# Patient Record
Sex: Male | Born: 1985 | Race: White | Hispanic: No | Marital: Married | Smoking: Never smoker
Health system: Southern US, Community
[De-identification: ages and names within clinical notes are randomized; demographics above are authoritative.]

---

## 2020-08-14 ENCOUNTER — Other Ambulatory Visit: Payer: Self-pay | Admitting: Family Medicine

## 2020-08-15 ENCOUNTER — Other Ambulatory Visit: Payer: Self-pay | Admitting: Family Medicine

## 2020-08-15 DIAGNOSIS — R1010 Upper abdominal pain, unspecified: Secondary | ICD-10-CM

## 2020-08-29 ENCOUNTER — Ambulatory Visit
Admission: RE | Admit: 2020-08-29 | Discharge: 2020-08-29 | Disposition: A | Discharge status: Home / Self Care | Payer: Self-pay | Source: Ambulatory Visit | Attending: Family Medicine | Admitting: Family Medicine

## 2020-08-29 DIAGNOSIS — R1010 Upper abdominal pain, unspecified: Secondary | ICD-10-CM

## 2020-09-12 ENCOUNTER — Other Ambulatory Visit: Payer: Self-pay | Admitting: Family Medicine

## 2020-09-12 DIAGNOSIS — R160 Hepatomegaly, not elsewhere classified: Secondary | ICD-10-CM

## 2020-09-16 ENCOUNTER — Other Ambulatory Visit: Payer: Self-pay

## 2020-09-16 ENCOUNTER — Ambulatory Visit
Admission: RE | Admit: 2020-09-16 | Discharge: 2020-09-16 | Disposition: A | Discharge status: Home / Self Care | Payer: Managed Care, Other (non HMO) | Source: Ambulatory Visit | Attending: Family Medicine | Admitting: Family Medicine

## 2020-09-16 DIAGNOSIS — R160 Hepatomegaly, not elsewhere classified: Secondary | ICD-10-CM

## 2020-09-16 IMAGING — MR MR ABDOMEN WO/W CM
12 of 17 series · 27 of 48 positions shown · IV contrast (multihance)
Comparison: Ultrasound [DATE]

CLINICAL DATA: Evaluate possible liver abnormality. Ultrasound
showed a hypoechoic lesion in the left hepatic lobe.

EXAM:
MRI ABDOMEN WITHOUT AND WITH CONTRAST
TECHNIQUE: Multiplanar multisequence MR imaging of the abdomen was performed
both before and after the administration of intravenous contrast.
CONTRAST:  20mL MULTIHANCE GADOBENATE DIMEGLUMINE 529 MG/ML IV SOLN

[Series 3: T2 · coronal · 5.0mm · 1.76mm/px · 1 of 40 slices shown (1 of 3)]
[im 1/40]
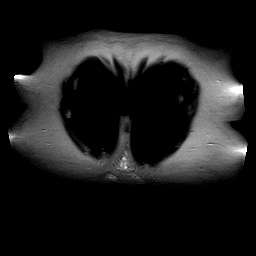

[Series 5: T2 · axial · 6.5mm · 0.88mm/px · 1 of 44 slices shown (2 of 3)]
[im 1/44]
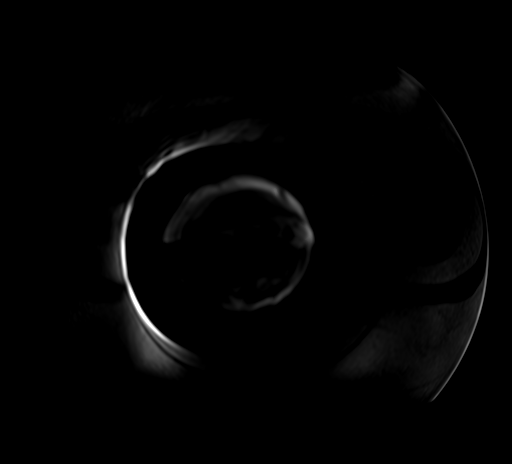

[Series 6: ep2d_diff_b50_500_800_p2 · axial · 6.0mm · 2.34mm/px · z∈[-176,+159]mm · 3 of 132 slices shown]
[im 1/132]
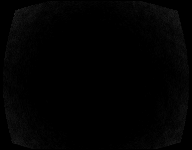
[im 66/132]
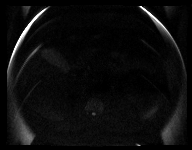
[im 132/132]
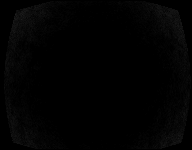

[Series 7: [id number]_500_800_p2_adc · axial · 6.0mm · 2.34mm/px · 1 of 44 slices shown]
[im 1/44]
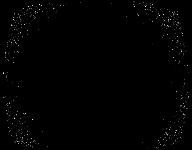

[Series 8: T2 · axial · 5.5mm · 1.76mm/px · 1 of 42 slices shown (3 of 3)]
[im 1/42]
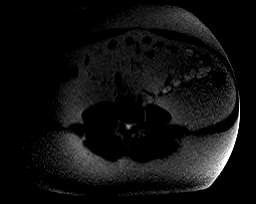

[Series 9: axial in out · axial · 6.5mm · 0.88mm/px · 1 of 80 slices shown]
[im 1/80]
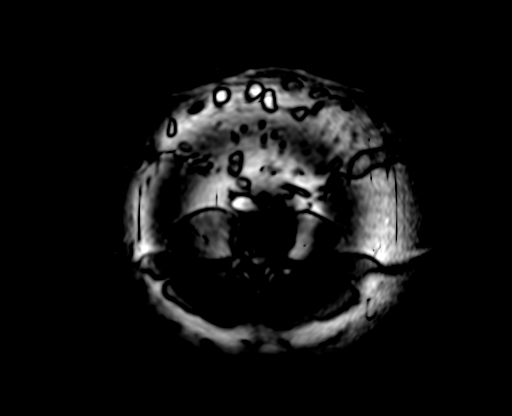

[Series 10: axial tru fisp · axial · 5.5mm · 1.76mm/px · 1 of 46 slices shown]
[im 1/46]
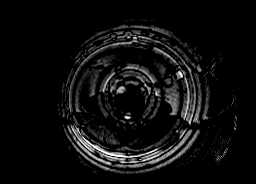

[Series 11: T1 dynamic · axial · non-contrast · 2.3mm · 0.88mm/px · z∈[-169,+110]mm · 4 of 120 slices shown]
[im 1/120]
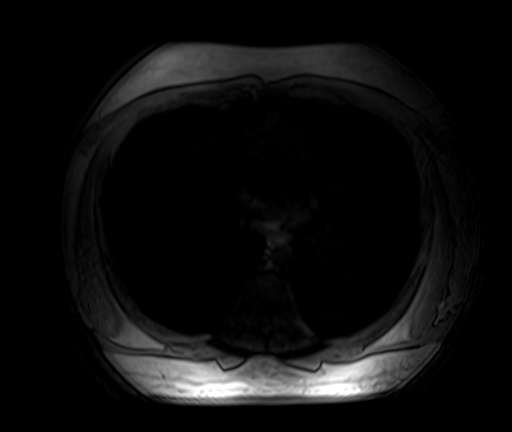
[im 40/120]
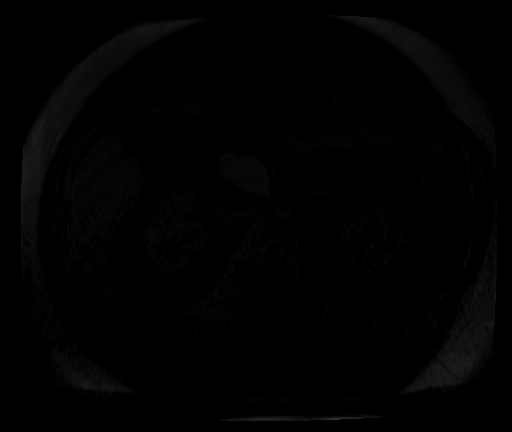
[im 80/120]
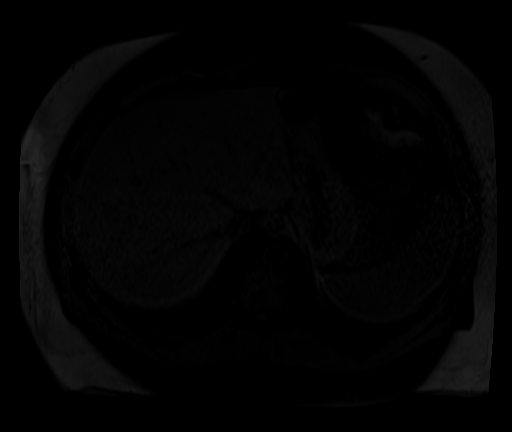
[im 120/120]
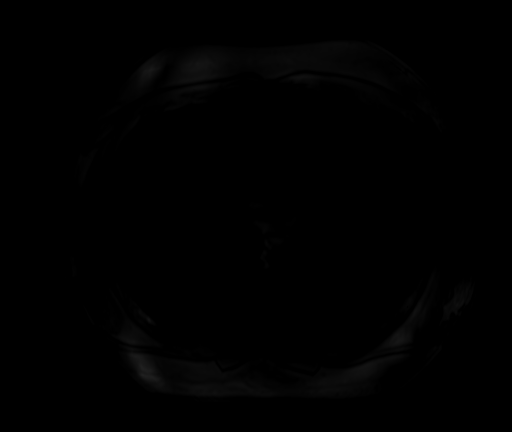

[Series 12: post 25 sec · axial · 2.3mm · 0.88mm/px · z∈[-169,+110]mm · 4 of 120 slices shown]
[im 1/120]
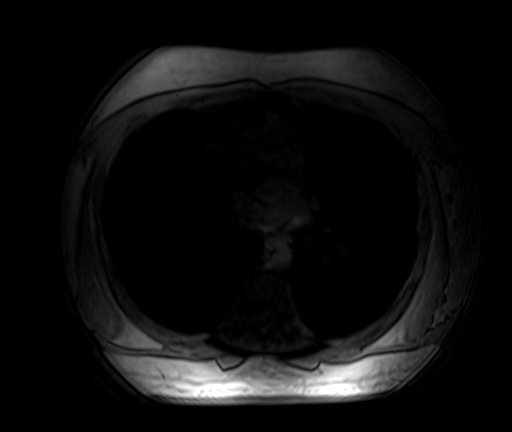
[im 40/120]
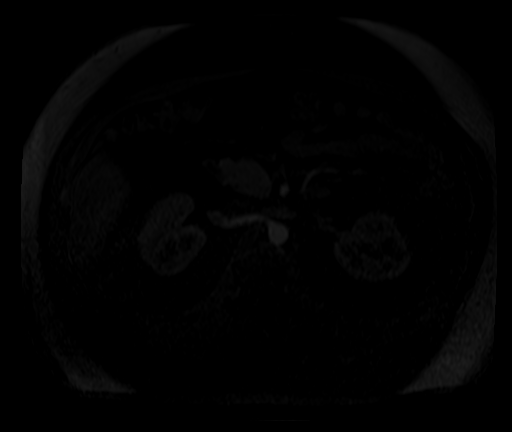
[im 80/120]
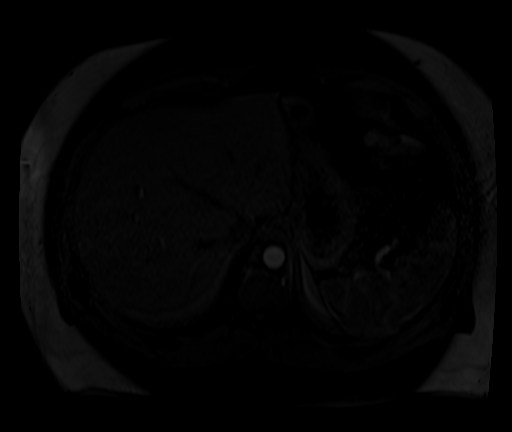
[im 120/120]
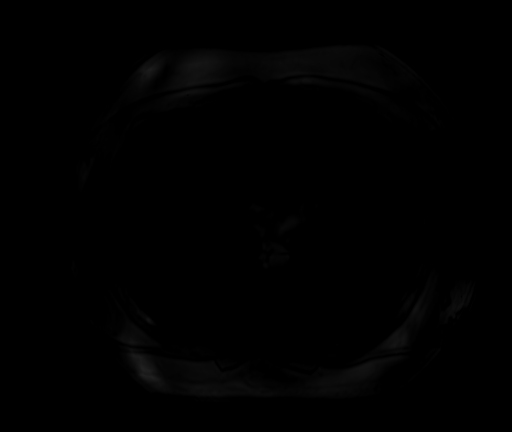

[Series 13: post 25 sec_sub · axial · 2.3mm · 0.88mm/px · z∈[-169,+110]mm · 4 of 117 slices shown]
[im 1/117]
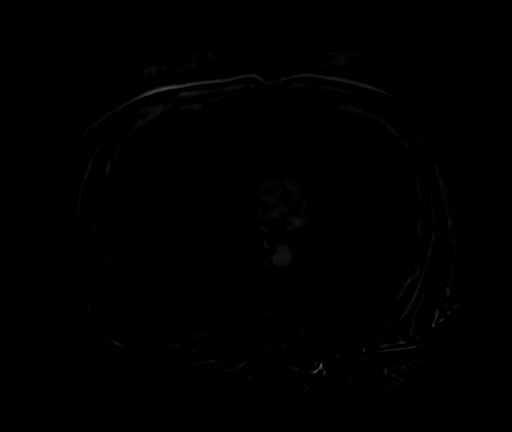
[im 39/117]
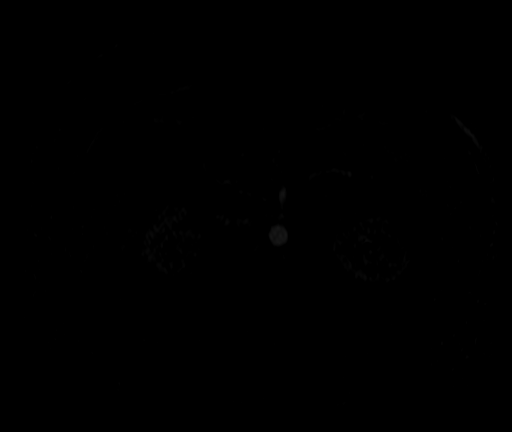
[im 78/117]
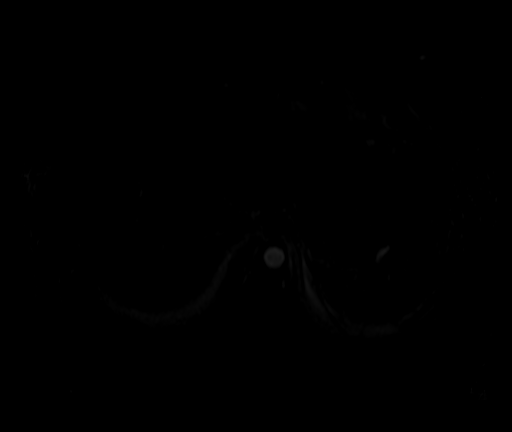
[im 117/117]
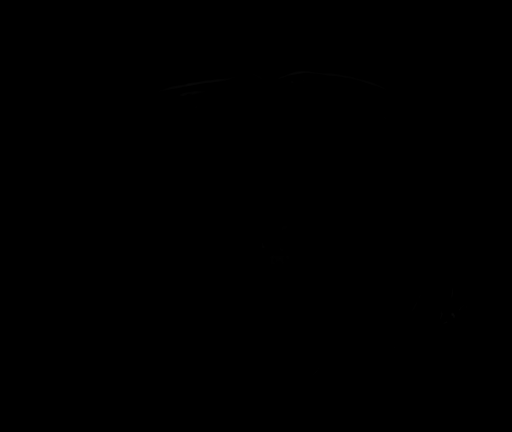

[Series 14: post 45 sec · axial · 2.3mm · 0.88mm/px · z∈[-169,+110]mm · 4 of 120 slices shown]
[im 1/120]
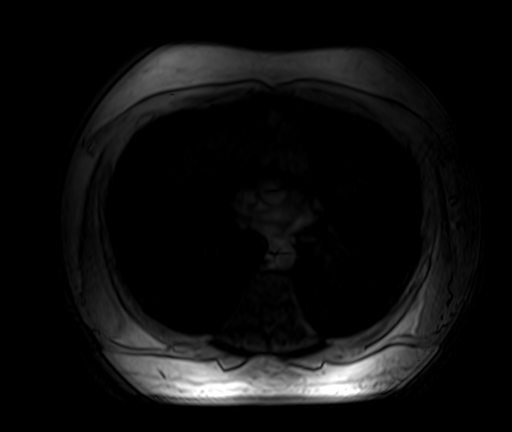
[im 40/120]
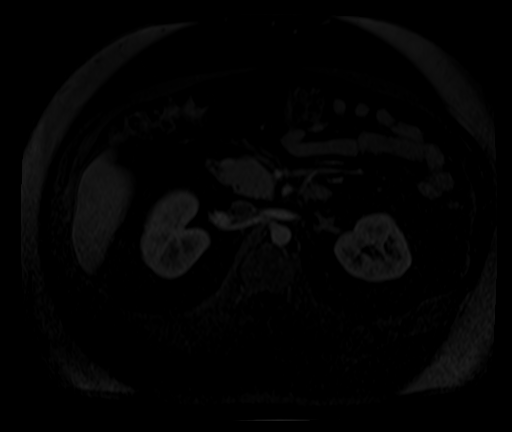
[im 80/120]
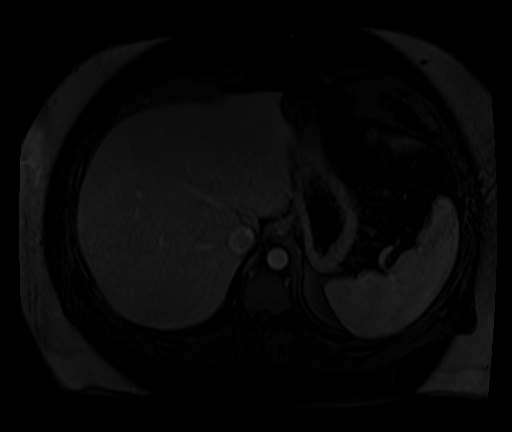
[im 120/120]
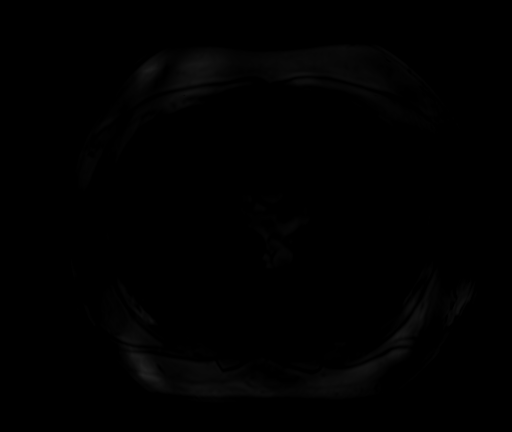

[Series 15: post 45 sec_sub · axial · 2.3mm · 0.88mm/px · z∈[-169,-78]mm · 2 of 120 slices shown]
[im 1/120]
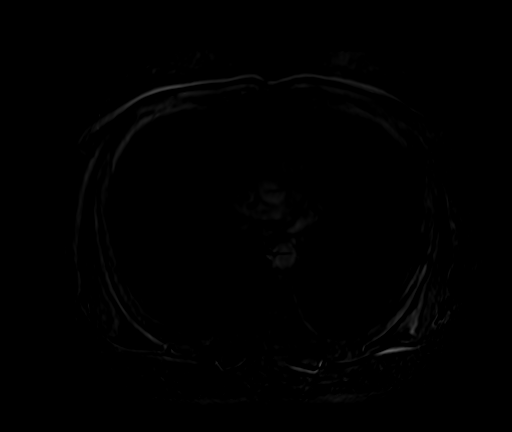
[im 40/120]
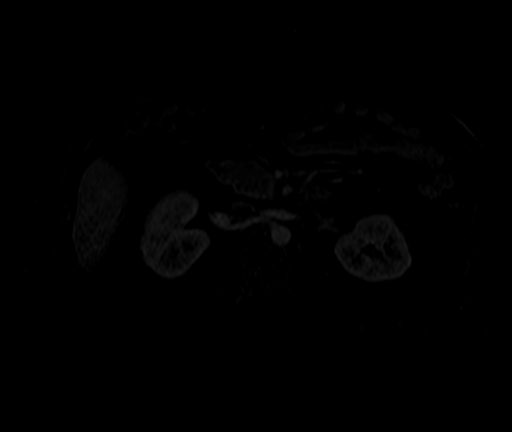

[27 of 48 positions shown; findings below may reference images not displayed]

FINDINGS: Lower chest: The lung bases are grossly clear. No pulmonary lesions
or pleural effusion. No pericardial effusion.

Hepatobiliary: There is mild diffuse fatty infiltration of the liver
noted on the out of phase T1 weighted gradient echo sequence.

There is a small enhancing lesion in segment 3 of the liver
anteriorly. It measures a maximum of 14 mm. Lesion is not visible on
the pre contrast images and is best seen on the 45 second
post-contrast images. Given its stealth appearance and enhancement
pattern FNH would be a strong possibility.

I do not see any other hepatic lesions. The gallbladder is
unremarkable. Normal caliber and course of the common bile duct.

Pancreas: No mass, inflammation or ductal dilatation. The pancreas
is short. There is no pancreatic tail and only part of the body.
This is most likely a form of dorsal agenesis.

Spleen:  Normal size.  No focal lesions.

Adrenals/Urinary Tract: The adrenal glands and kidneys are
unremarkable. No renal lesions or hydronephrosis.

Stomach/Bowel: The stomach, duodenum, visualized small bowel and
visualized colon are grossly normal.

Vascular/Lymphatic: The aorta and branch vessels are patent. The
major venous structures are patent. No mesenteric or retroperitoneal
mass or adenopathy.

Other:  No ascites or abdominal wall hernia.

Musculoskeletal: No significant bony findings.
IMPRESSION: 1. 14 mm enhancing lesion in segment 3 of the liver anteriorly.
Given its stealth appearance and enhancement pattern FNH would be a
strong possibility. Recommend followup MR examination in 6 months
with Eovist for contrast.
2. Mild diffuse fatty infiltration of the liver.
3. No other significant abdominal findings, mass lesions or
adenopathy.

## 2020-09-16 MED ORDER — GADOBENATE DIMEGLUMINE 529 MG/ML IV SOLN
20.0000 mL | Freq: Once | INTRAVENOUS | Status: AC | PRN
  Administered 2020-09-16: 20 mL via INTRAVENOUS

## 2021-04-03 ENCOUNTER — Other Ambulatory Visit: Payer: Self-pay | Admitting: Family Medicine

## 2021-04-03 DIAGNOSIS — R160 Hepatomegaly, not elsewhere classified: Secondary | ICD-10-CM

## 2021-04-25 ENCOUNTER — Ambulatory Visit
Admission: RE | Admit: 2021-04-25 | Discharge: 2021-04-25 | Disposition: A | Discharge status: Home / Self Care | Payer: Managed Care, Other (non HMO) | Source: Ambulatory Visit | Attending: Family Medicine | Admitting: Family Medicine

## 2021-04-25 DIAGNOSIS — R160 Hepatomegaly, not elsewhere classified: Secondary | ICD-10-CM

## 2021-04-25 IMAGING — MR MR ABDOMEN WO/W CM
17 series · 48 of 48 positions shown · IV contrast (20 ml multihance)
Comparison: MRI [DATE] and ultrasound [DATE]

CLINICAL DATA: Follow-up hepatic lesion

EXAM:
MRI ABDOMEN WITHOUT AND WITH CONTRAST
TECHNIQUE: Multiplanar multisequence MR imaging of the abdomen was performed
both before and after the administration of intravenous contrast.
CONTRAST:  20mL MULTIHANCE GADOBENATE DIMEGLUMINE 529 MG/ML IV SOLN

[Series 3: T2 · coronal · 5.0mm · 1.60mm/px · 3 of 45 slices shown (1 of 3)]
[im 1/45]
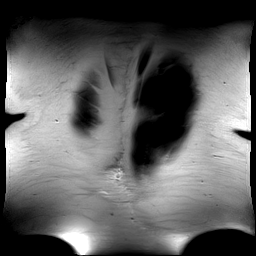
[im 23/45]
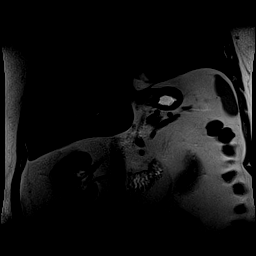
[im 45/45]
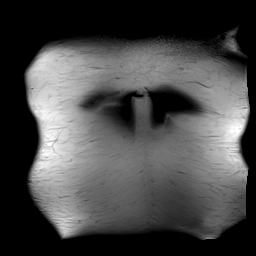

[Series 4: T1 · axial · 3.0mm · 1.25mm/px · z∈[-81,+156]mm · 7 of 160 slices shown]
[im 1/160]
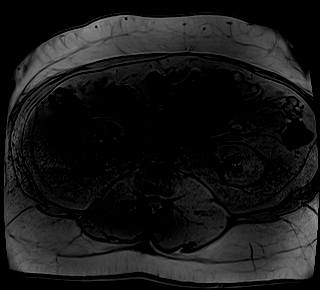
[im 27/160]
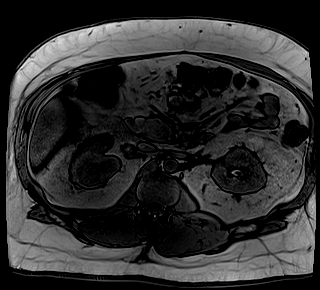
[im 54/160]
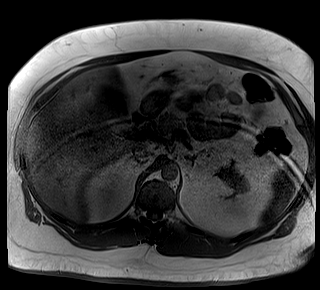
[im 80/160]
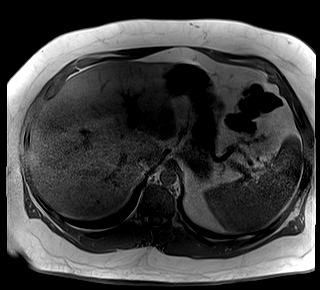
[im 107/160]
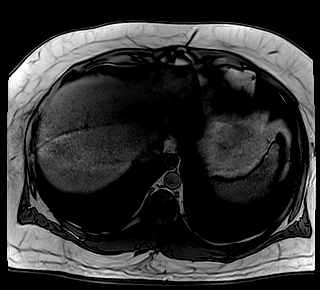
[im 133/160]
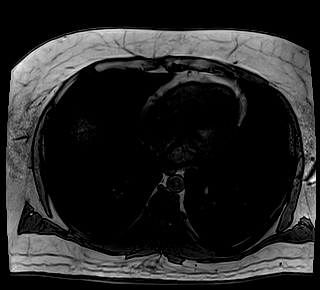
[im 160/160]
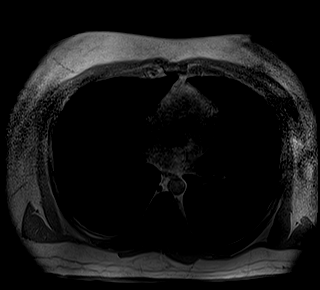

[Series 5: bSSFP · axial · 5.0mm · 1.25mm/px · 1 of 38 slices shown]
[im 1/38]
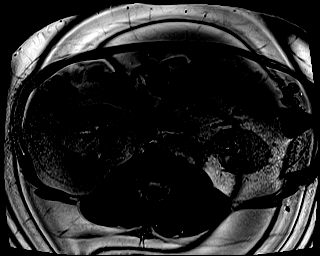

[Series 6: T2 · axial · 5.0mm · 1.56mm/px · 1 of 38 slices shown (2 of 3)]
[im 1/38]
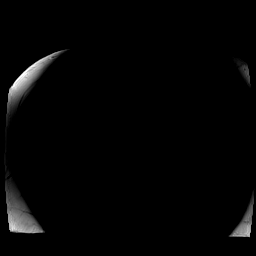

[Series 7: DWI · axial · 5.0mm · 1.49mm/px · z∈[-21,+200]mm · 4 of 114 slices shown (1 of 2)]
[im 1/114]
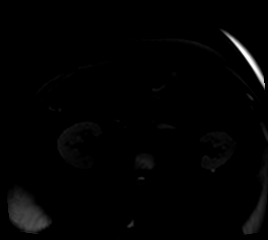
[im 38/114]
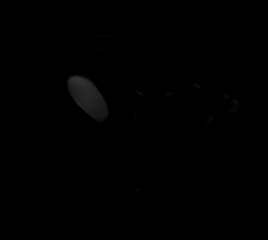
[im 76/114]
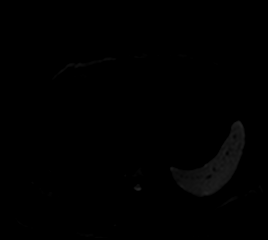
[im 114/114]
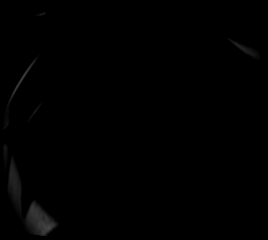

[Series 8: DWI · axial · 5.0mm · 1.49mm/px · 1 of 38 slices shown (2 of 2)]
[im 1/38]
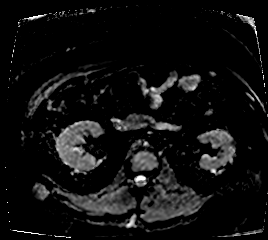

[Series 9: T2 · axial · 6.0mm · 1.25mm/px · 1 of 35 slices shown (3 of 3)]
[im 1/35]
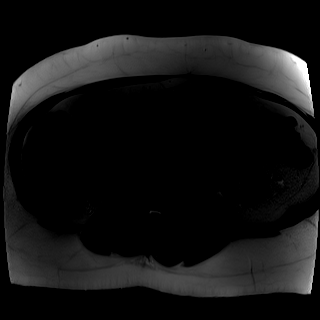

[Series 10: T1 dynamic · axial · non-contrast · 3.0mm · 1.25mm/px · z∈[-81,+156]mm · 3 of 80 slices shown]
[im 1/80]
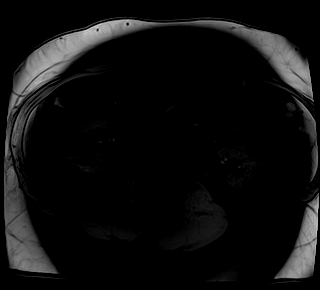
[im 40/80]
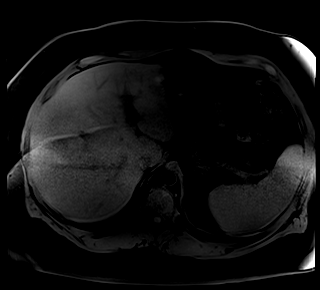
[im 80/80]
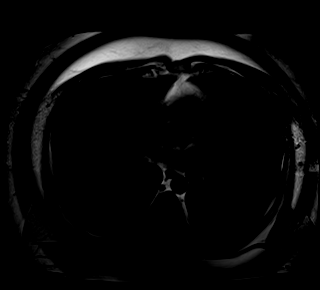

[Series 11: T1 dynamic post-contrast · axial · 3.0mm · 1.25mm/px · z∈[-81,+156]mm · 3 of 80 slices shown (1 of 9)]
[im 1/80]
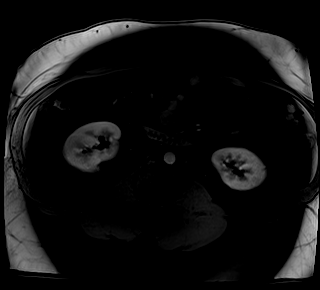
[im 40/80]
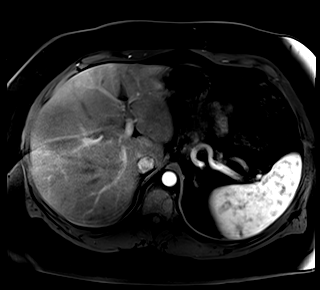
[im 80/80]
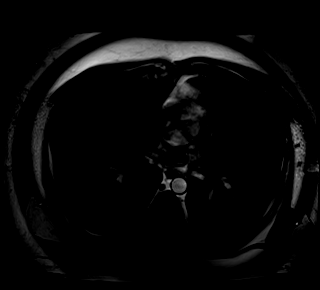

[Series 12: T1 dynamic post-contrast · axial · 3.0mm · 1.25mm/px · z∈[-81,+156]mm · 3 of 80 slices shown (2 of 9)]
[im 1/80]
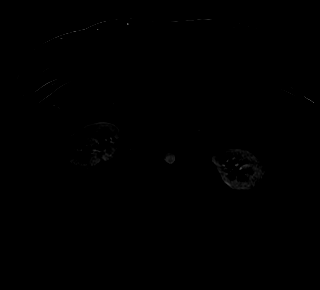
[im 40/80]
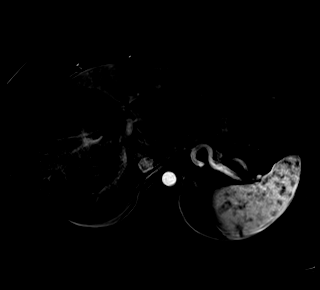
[im 80/80]
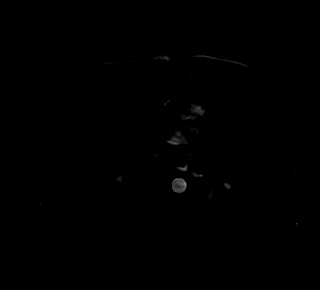

[Series 13: T1 dynamic post-contrast · axial · 3.0mm · 1.25mm/px · z∈[-81,+156]mm · 3 of 80 slices shown (3 of 9)]
[im 1/80]
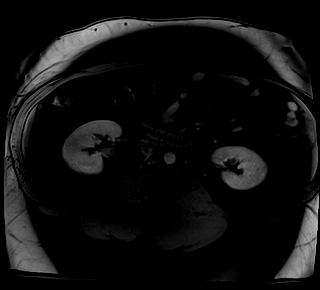
[im 40/80]
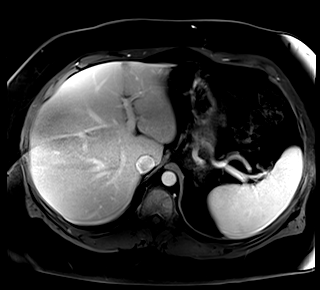
[im 80/80]
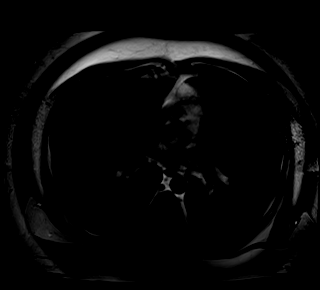

[Series 14: T1 dynamic post-contrast · axial · 3.0mm · 1.25mm/px · z∈[-81,+156]mm · 3 of 80 slices shown (4 of 9)]
[im 1/80]
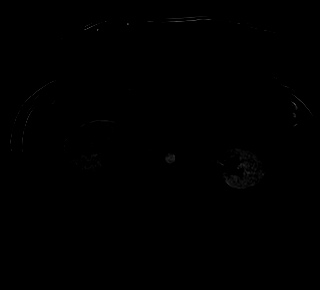
[im 40/80]
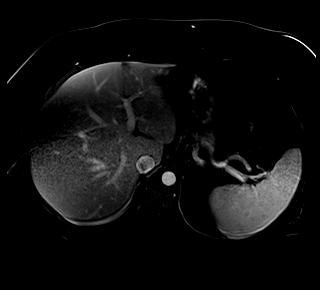
[im 80/80]
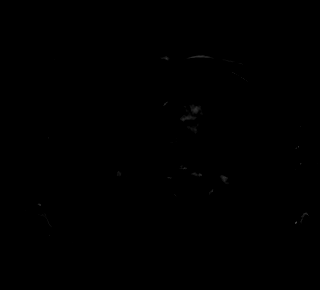

[Series 15: T1 dynamic post-contrast · axial · 3.0mm · 1.25mm/px · z∈[-81,+156]mm · 3 of 80 slices shown (5 of 9)]
[im 1/80]
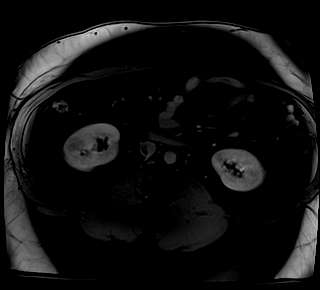
[im 40/80]
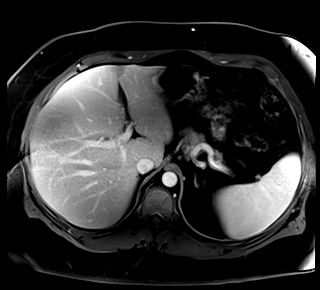
[im 80/80]
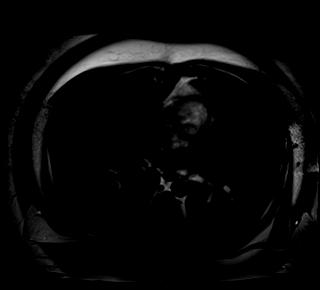

[Series 16: T1 dynamic post-contrast · axial · 3.0mm · 1.25mm/px · z∈[-81,+156]mm · 3 of 80 slices shown (6 of 9)]
[im 1/80]
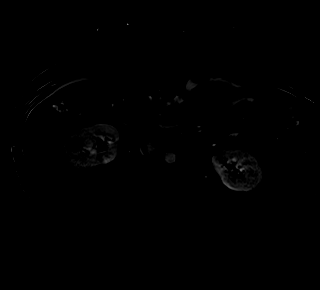
[im 40/80]
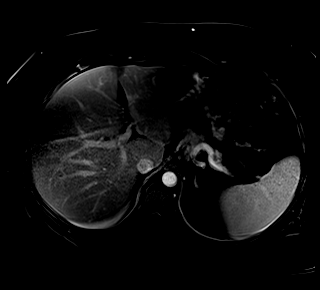
[im 80/80]
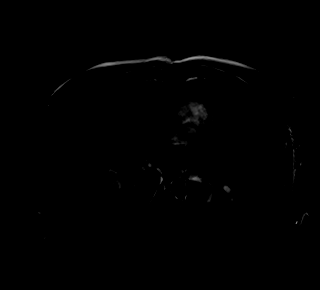

[Series 17: T1 dynamic post-contrast · coronal · 3.0mm · 1.25mm/px · 3 of 88 slices shown (7 of 9)]
[im 1/88]
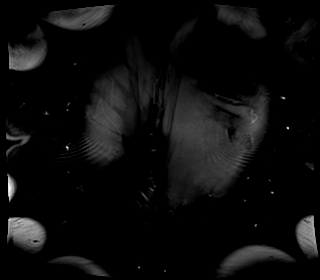
[im 44/88]
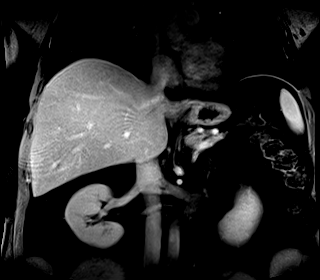
[im 88/88]
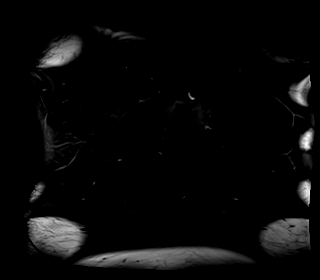

[Series 18: T1 dynamic post-contrast · axial · 3.0mm · 1.25mm/px · z∈[-81,+156]mm · 3 of 80 slices shown (8 of 9)]
[im 1/80]
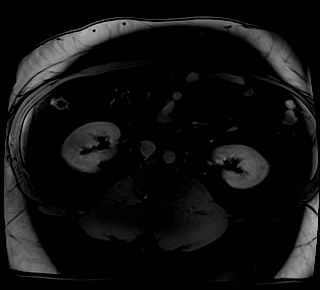
[im 40/80]
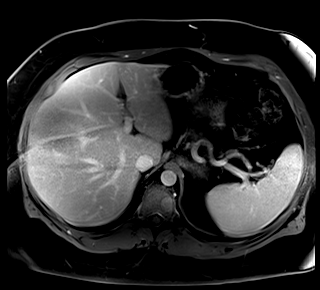
[im 80/80]
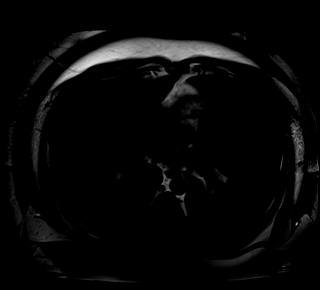

[Series 19: T1 dynamic post-contrast · axial · 3.0mm · 1.25mm/px · z∈[-81,+156]mm · 3 of 80 slices shown (9 of 9)]
[im 1/80]
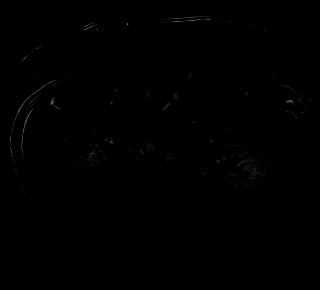
[im 40/80]
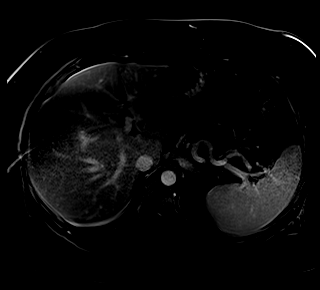
[im 80/80]
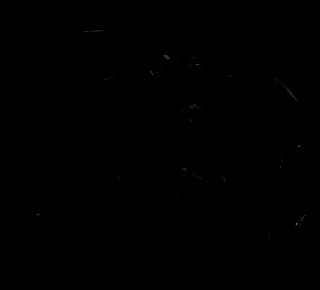

[48 of 48 positions shown; findings below may reference images not displayed]

FINDINGS: Lower chest: No acute abnormality.

Hepatobiliary: Mild diffuse hepatic steatosis. Stable 1.3 cm segment
III hepatic lesion which is intrinsically T1 isointense to
background liver with minimal increased T2 signal and arterial
hyperenhancement on image 36/11 without washout or capsule. No new
hepatic lesions. Gallbladder is unremarkable. No biliary ductal
dilation.

Pancreas: Intrinsic T1 signal of the pancreatic parenchyma is within
normal limits. No pancreatic ductal dilation. No suspicious
pancreatic lesion identified.

Spleen:  Within normal limits.

Adrenals/Urinary Tract: Bilateral adrenal glands are unremarkable.
No hydronephrosis. No solid enhancing renal mass visualized.

Stomach/Bowel: Visualized portions within the abdomen are
unremarkable.

Vascular/Lymphatic: No pathologically enlarged lymph nodes
identified. No abdominal aortic aneurysm demonstrated.

Other:  None.

Musculoskeletal: No suspicious bone lesions identified.
IMPRESSION: 1. Stable 1.3 cm segment III hepatic lesion which is again almost
certainly benign with differential considerations including focal
nodular hyperplasia versus hepatic adenoma. Recommend follow-up MRI
in 1 year with and without EOVIST (hepatobiliary) contrast to ensure
stability and for more definitive characterization.
2. Mild diffuse hepatic steatosis.

## 2021-04-25 MED ORDER — GADOBENATE DIMEGLUMINE 529 MG/ML IV SOLN
20.0000 mL | Freq: Once | INTRAVENOUS | Status: AC | PRN
  Administered 2021-04-25: 20 mL via INTRAVENOUS

## 2021-12-01 ENCOUNTER — Encounter (HOSPITAL_BASED_OUTPATIENT_CLINIC_OR_DEPARTMENT_OTHER): Payer: Self-pay

## 2021-12-01 ENCOUNTER — Other Ambulatory Visit: Payer: Self-pay

## 2021-12-01 ENCOUNTER — Emergency Department (HOSPITAL_BASED_OUTPATIENT_CLINIC_OR_DEPARTMENT_OTHER)
Admission: EM | Admit: 2021-12-01 | Discharge: 2021-12-01 | Disposition: A | Discharge status: Home / Self Care | Payer: Managed Care, Other (non HMO) | Attending: Emergency Medicine | Admitting: Emergency Medicine

## 2021-12-01 ENCOUNTER — Emergency Department (HOSPITAL_BASED_OUTPATIENT_CLINIC_OR_DEPARTMENT_OTHER): Payer: Managed Care, Other (non HMO) | Admitting: Radiology

## 2021-12-01 DIAGNOSIS — B349 Viral infection, unspecified: Secondary | ICD-10-CM

## 2021-12-01 DIAGNOSIS — R531 Weakness: Secondary | ICD-10-CM | POA: Diagnosis not present

## 2021-12-01 LAB — CBC
HCT: 48.6 % (ref 39.0–52.0)
Hemoglobin: 16.1 g/dL (ref 13.0–17.0)
MCH: 27 pg (ref 26.0–34.0)
MCHC: 33.1 g/dL (ref 30.0–36.0)
MCV: 81.4 fL (ref 80.0–100.0)
Platelets: 360 10*3/uL (ref 150–400)
RBC: 5.97 MIL/uL — ABNORMAL HIGH (ref 4.22–5.81)
RDW: 12.7 % (ref 11.5–15.5)
WBC: 5.1 10*3/uL (ref 4.0–10.5)
nRBC: 0 % (ref 0.0–0.2)

## 2021-12-01 LAB — BASIC METABOLIC PANEL
Anion gap: 11 (ref 5–15)
BUN: 14 mg/dL (ref 6–20)
CO2: 25 mmol/L (ref 22–32)
Calcium: 10.1 mg/dL (ref 8.9–10.3)
Chloride: 104 mmol/L (ref 98–111)
Creatinine, Ser: 0.99 mg/dL (ref 0.61–1.24)
GFR, Estimated: >60 mL/min (ref >60)
Glucose, Bld: 97 mg/dL (ref 70–99)
Potassium: 3.6 mmol/L (ref 3.5–5.1)
Sodium: 140 mmol/L (ref 135–145)

## 2021-12-01 LAB — URINALYSIS, ROUTINE W REFLEX MICROSCOPIC
Bilirubin Urine: NEGATIVE
Glucose, UA: NEGATIVE mg/dL
Hgb urine dipstick: NEGATIVE
Ketones, ur: NEGATIVE mg/dL
Leukocytes,Ua: NEGATIVE
Nitrite: NEGATIVE
Protein, ur: NEGATIVE mg/dL
Specific Gravity, Urine: 1.025 (ref 1.005–1.030)
pH: 6 (ref 5.0–8.0)

## 2021-12-01 MED ORDER — LACTATED RINGERS IV BOLUS
1000.0000 mL | Freq: Once | INTRAVENOUS | Status: AC
  Administered 2021-12-01: 1000 mL via INTRAVENOUS

## 2021-12-01 NOTE — ED Triage Notes (Addendum)
Pt reports having a stomach virus last week and now feels weak. Pt states that daily tasks fatigue him that usually would not. Pt states that he has not been eating and drinking because of the recent stomach virus. Pt ambulatory to triage. Dyspneic on exertion. Pt also endorses lightheadedness.

## 2021-12-01 NOTE — Discharge Instructions (Signed)
Your exam and your labs today was very reassuring.  As we discussed, you have weakness in your legs after a viral illness, there is a small concern that you may develop something called Guillain-Barr.  I do not think that this is what is occurring and you are most likely suffering from generalized weakness after a viral infection that will resolve in a few days of rest.  However, if symptoms worsen at all, please promptly return to the emergency department.

## 2021-12-01 NOTE — ED Notes (Signed)
Discharge paperwork given and understood. 

## 2021-12-03 NOTE — ED Provider Notes (Signed)
[facility] EMERGENCY DEPT Provider Note   CSN: [id number] Arrival date & time: 12/01/21  1654     History  Chief Complaint  Patient presents with   Fatigue   Shortness of Breath    Luke Todd is a 36 y.o. male who presents to the ED for evaluation weakness following a stomach virus last week.  Patient states that he was having vomiting and diarrhea for approximately 4 to 5 days which has now resolved.  However, he has noticed since the symptoms have resolved, he feels weak with his daily tasks.  He notes that he and his wife are currently moving, and he will be packing a box and become very weak and winded.  When asked, he notes that the weakness seems to start in his legs and then his arms but ultimately results in a global sensation of weakness.  He also experiences dizziness described as lightheadedness during these episodes.  Symptoms resolve with rest.  He denies chest pain, shortness of breath, abdominal pain, nausea, numbness and tingling.   Shortness of Breath     Home Medications Prior to Admission medications   Not on File      Allergies    Cefprozil    Review of Systems   Review of Systems  Respiratory:  Positive for shortness of breath.    Physical Exam Updated Vital Signs BP 133/90   Pulse 74   Temp 97.9 F (36.6 C)   Resp 12   Ht 6' (1.829 m)   Wt (!) 145.2 kg   SpO2 99%   BMI 43.40 kg/m  Physical Exam Vitals and nursing note reviewed.  Constitutional:      General: He is not in acute distress.    Appearance: He is not ill-appearing.  HENT:     Head: Atraumatic.  Eyes:     Conjunctiva/sclera: Conjunctivae normal.  Cardiovascular:     Rate and Rhythm: Normal rate and regular rhythm.     Pulses: Normal pulses.     Heart sounds: No murmur heard. Pulmonary:     Effort: Pulmonary effort is normal. No respiratory distress.     Breath sounds: Normal breath sounds.  Abdominal:     General: Abdomen is flat. There is no distension.      Palpations: Abdomen is soft.     Tenderness: There is no abdominal tenderness.     Comments: Abdomen is soft, nondistended and nontender to palpation.  Musculoskeletal:        General: Normal range of motion.     Cervical back: Normal range of motion.  Skin:    General: Skin is warm and dry.     Capillary Refill: Capillary refill takes less than 2 seconds.  Neurological:     General: No focal deficit present.     Mental Status: He is alert.     Deep Tendon Reflexes:     Reflex Scores:      Tricep reflexes are 2+ on the right side and 2+ on the left side.      Patellar reflexes are 2+ on the right side and 2+ on the left side.    Comments: Patient has intact reflexes of the upper and lower extremities bilaterally.  He has strong strength against significant resistance with straight leg raise off the bed and with dorsiflexion and plantarflexion.  5/5 grip strength bilaterally.    I provided resistance for about 30 seconds each leg and strength remains strong  Subjective sensation intact bilaterally.  Psychiatric:        Mood and Affect: Mood normal.    ED Results / Procedures / Treatments   Labs (all labs ordered are listed, but only abnormal results are displayed) Labs Reviewed  CBC - Abnormal; Notable for the following components:      Result Value   RBC 5.97 (*)    All other components within normal limits  BASIC METABOLIC PANEL  URINALYSIS, ROUTINE W REFLEX MICROSCOPIC    EKG EKG Interpretation  Date/Time:  Monday December 01 2021 17:09:05 EDT Ventricular Rate:  95 PR Interval:  168 QRS Duration: 86 QT Interval:  350 QTC Calculation: 439 R Axis:   12 Text Interpretation: Normal sinus rhythm Inferior infarct , age undetermined Abnormal ECG No previous ECGs available Confirmed by Ernie Avena ([id number]) on 12/01/2021 8:51:16 PM  Radiology No results found.  Procedures Procedures    Medications Ordered in ED Medications  lactated ringers bolus 1,000 mL (0 mLs  Intravenous Stopped 12/01/21 2218)    ED Course/ Medical Decision Making/ A&P                           Medical Decision Making Amount and/or Complexity of Data Reviewed Labs: ordered. Radiology: ordered.   Social determinants of health:  Social History   Socioeconomic History   Marital status: Married    Spouse name: Not on file   Number of children: Not on file   Years of education: Not on file   Highest education level: Not on file  Occupational History   Not on file  Tobacco Use   Smoking status: Never   Smokeless tobacco: Never  Substance and Sexual Activity   Alcohol use: Never   Drug use: Never   Sexual activity: Not on file  Other Topics Concern   Not on file  Social History Narrative   Not on file   Social Determinants of Health   Financial Resource Strain: Not on file  Food Insecurity: Not on file  Transportation Needs: Not on file  Physical Activity: Not on file  Stress: Not on file  Social Connections: Not on file  Intimate Partner Violence: Not on file     Initial impression:  This patient presents to the ED for concern of weakness after viral illness, this involves an extensive number of treatment options, and is a complaint that carries with it a high risk of complications and morbidity.   Differentials include generalized weakness after illness, Guillain-Barr, stroke, cauda equina.   Comorbidities affecting care:  None  Additional history obtained: Wife  Lab Tests  I Ordered, reviewed, and interpreted labs and EKG.  The pertinent results include:  BMP, UA and CBC unremarkable  Imaging Studies ordered:  I ordered imaging studies including  Chest x-ray without acute findings I independently visualized and interpreted imaging and I agree with the radiologist interpretation.   EKG: Normal sinus rhythm  Cardiac Monitoring:  The patient was maintained on a cardiac monitor.  I personally viewed and interpreted the cardiac monitored  which showed an underlying rhythm of: Sinus rhythm   Medicines ordered and prescription drug management:  I ordered medication including: 1 L LR bolus Reevaluation of the patient after these medicines showed that the patient improved I have reviewed the patients home medicines and have made adjustments as needed   ED Course/Re-evaluation: 36 year old male presents to the ED for evaluation of weakness following a viral infection.  Patient is overall well-appearing and  nontoxic.  Vitals are without significant abnormality.  On exam, lungs are CTA bilaterally.  There is no abdominal tenderness.  No spinal or paraspinal tenderness.  Patient has intact reflexes of the upper and lower extremities bilaterally.  Strength of the upper and lower limbs against resistance is grossly intact.  Symptoms are not consistent with cauda equina or stroke.  Labs were without acute findings.  Chest x-ray was normal.  I did consider obtaining a lumbar puncture to assess for Guillain-Barr, however given patient's reassuring neuro exam, I do not think that this was necessary at this time.  Patient is likely suffering from postviral weakness from the toll that it took on his body and he will likely improve after a few more days.  Encouraged increased water intake.  We did discuss strict return precautions and advised to return promptly to the emergency department if he notices worsening weakness.  Patient expresses understanding is amenable to plan.  Disposition:  After consideration of the diagnostic results, physical exam, history and the patients response to treatment feel that the patent would benefit from discharge.   Weakness Viral illness: Plan and management as described above. Discharged home in good condition.  Final Clinical Impression(s) / ED Diagnoses Final diagnoses:  Weakness  Viral illness    Rx / DC Orders ED Discharge Orders     None         Janell QuietConklin, Maira Christon R, PA-C 12/03/21 1916     Ernie AvenaLawsing, James, MD 12/04/21 [phone]
